# Patient Record
Sex: Female | Born: 1960 | Race: White | Hispanic: No | Marital: Married | Smoking: Current every day smoker
Health system: Southern US, Community
[De-identification: ages and names within clinical notes are randomized; demographics above are authoritative.]

## PROBLEM LIST (undated history)

## (undated) HISTORY — PX: CATARACT EXTRACTION: SUR2

## (undated) HISTORY — PX: TONSILLECTOMY: SUR1361

## (undated) HISTORY — PX: ASD REPAIR: SHX258

---

## 2011-06-24 ENCOUNTER — Ambulatory Visit: Payer: Self-pay | Admitting: Internal Medicine

## 2012-01-19 ENCOUNTER — Ambulatory Visit: Payer: Self-pay | Admitting: Family Medicine

## 2012-01-25 ENCOUNTER — Ambulatory Visit: Payer: Self-pay | Admitting: Family Medicine

## 2012-03-17 ENCOUNTER — Ambulatory Visit: Payer: Self-pay | Admitting: Family Medicine

## 2012-03-17 LAB — URINALYSIS, COMPLETE
Glucose,UR: NEGATIVE mg/dL (ref 0–75)
Ketone: NEGATIVE
Nitrite: NEGATIVE
Ph: 6 (ref 4.5–8.0)
RBC,UR: >30 /HPF (ref 0–5)
WBC UR: >30 /HPF (ref 0–5)

## 2012-03-19 LAB — URINE CULTURE

## 2013-02-28 ENCOUNTER — Ambulatory Visit: Payer: Self-pay | Admitting: Family Medicine

## 2015-10-13 ENCOUNTER — Other Ambulatory Visit: Payer: Self-pay | Admitting: Family Medicine

## 2015-10-13 DIAGNOSIS — Z1231 Encounter for screening mammogram for malignant neoplasm of breast: Secondary | ICD-10-CM

## 2016-07-26 ENCOUNTER — Encounter: Payer: Self-pay | Admitting: Emergency Medicine

## 2016-07-26 ENCOUNTER — Emergency Department: Payer: 59

## 2016-07-26 ENCOUNTER — Emergency Department
Admission: EM | Admit: 2016-07-26 | Discharge: 2016-07-26 | Disposition: A | Discharge status: Home / Self Care | Payer: 59 | Attending: Emergency Medicine | Admitting: Emergency Medicine

## 2016-07-26 DIAGNOSIS — Y9389 Activity, other specified: Secondary | ICD-10-CM | POA: Insufficient documentation

## 2016-07-26 DIAGNOSIS — S0101XA Laceration without foreign body of scalp, initial encounter: Secondary | ICD-10-CM | POA: Diagnosis not present

## 2016-07-26 DIAGNOSIS — Y999 Unspecified external cause status: Secondary | ICD-10-CM | POA: Insufficient documentation

## 2016-07-26 DIAGNOSIS — Y92009 Unspecified place in unspecified non-institutional (private) residence as the place of occurrence of the external cause: Secondary | ICD-10-CM | POA: Insufficient documentation

## 2016-07-26 DIAGNOSIS — F17200 Nicotine dependence, unspecified, uncomplicated: Secondary | ICD-10-CM | POA: Diagnosis not present

## 2016-07-26 DIAGNOSIS — W01118A Fall on same level from slipping, tripping and stumbling with subsequent striking against other sharp object, initial encounter: Secondary | ICD-10-CM | POA: Diagnosis not present

## 2016-07-26 DIAGNOSIS — S0990XA Unspecified injury of head, initial encounter: Secondary | ICD-10-CM

## 2016-07-26 MED ORDER — BACITRACIN ZINC 500 UNIT/GM EX OINT
TOPICAL_OINTMENT | Freq: Two times a day (BID) | CUTANEOUS | Status: DC
  Administered 2016-07-26: 1 via TOPICAL
  Filled 2016-07-26: qty 0.9

## 2016-07-26 MED ORDER — LIDOCAINE-EPINEPHRINE-TETRACAINE (LET) SOLUTION
3.0000 mL | Freq: Once | NASAL | Status: AC
  Administered 2016-07-26: 3 mL via TOPICAL
  Filled 2016-07-26: qty 3

## 2016-07-26 NOTE — ED Triage Notes (Addendum)
Patient ambulatory to triage with steady gait, without difficulty or distress noted; pt denies any c/o; st slipped on rug hitting head on nightstand; denies LOC or any c/o pain; approx 1" loc noted to occipital region with no active bleeding; no cervical tenderness with palpation

## 2016-07-26 NOTE — ED Notes (Addendum)
1cm/2cm posterior of head superficial laceration noted. Bleeding controlled. Pt denies headache, Tdap updated x3 years, denies taking anti-coagulant. Pt alerts and oriented x3 at this time.

## 2016-07-26 NOTE — ED Provider Notes (Signed)
[facility] Emergency Department Provider Note   ____________________________________________   First MD Initiated Contact with Patient 07/26/16 0345     (approximate)  I have reviewed the triage vital signs and the nursing notes.   HISTORY  Chief Complaint Fall    HPI Victoria Armstrong is a 55 y.o. female who comes into the hospital today with a head injury. The patient reports that she went to bed and got up to use the restroom. The patient got tangled on the rug and fell straight backwards. The patient's significant other reports that she hit the back of her head on the nightstand. The patient reports that she feels fine and she just wants to go to sleep. According to the significant other is sounded bad but the patient reports that it does not hurt very much. The patient denies any loss of consciousness, dizziness, blurred vision. She reports that she just feels tired and wants to go to sleep. The patient reports she is here because her significant other made her come in to get evaluated.   History reviewed. No pertinent past medical history.  There are no active problems to display for this patient.   Past Surgical History:  Procedure Laterality Date  . ASD REPAIR    . CATARACT EXTRACTION    . TONSILLECTOMY      Prior to Admission medications   Not on File    Allergies Amoxicillin  No family history on file.  Social History Social History  Substance Use Topics  . Smoking status: Current Every Day Smoker  . Smokeless tobacco: Not on file  . Alcohol use No    Review of Systems Constitutional: No fever/chills Eyes: No visual changes. ENT: No sore throat. Cardiovascular: Denies chest pain. Respiratory: Denies shortness of breath. Gastrointestinal: No abdominal pain.  No nausea, no vomiting.  No diarrhea.  No constipation. Genitourinary: Negative for dysuria. Musculoskeletal: Negative for back pain. Skin: Negative for  rash. Neurological: head pain  10-point ROS otherwise negative.  ____________________________________________   PHYSICAL EXAM:  VITAL SIGNS: ED Triage Vitals  Enc Vitals Group     BP 07/26/16 0055 130/78     Pulse Rate 07/26/16 0055 84     Resp 07/26/16 0055 20     Temp 07/26/16 0055 97.7 F (36.5 C)     Temp Source 07/26/16 0055 Oral     SpO2 07/26/16 0055 98 %     Weight 07/26/16 0055 158 lb (71.7 kg)     Height 07/26/16 0055 5\' 4"  (1.626 m)     Head Circumference --      Peak Flow --      Pain Score 07/26/16 0342 0     Pain Loc --      Pain Edu? --      Excl. in GC? --     Constitutional: Alert and oriented. Well appearing and in no acute distress. Eyes: Conjunctivae are normal. PERRL. EOMI. Head: Atraumatic. Nose: No congestion/rhinnorhea. Mouth/Throat: Mucous membranes are moist.  Oropharynx non-erythematous. Neck: No cervical spine tenderness to palpation Cardiovascular: Normal rate, regular rhythm. Grossly normal heart sounds.  Good peripheral circulation. Respiratory: Normal respiratory effort.  No retractions. Lungs CTAB. Gastrointestinal: Soft and nontender. No distention.positive bowel sounds Musculoskeletal: No lower extremity tenderness nor edema.   Neurologic:  Normal speech and language. No gross focal neurologic deficits are appreciated. No gait instability. Skin:  Skin is warm, dry with posterior scalp laceration.  Psychiatric: Mood and affect are normal.  ____________________________________________   LABS (all labs ordered are listed, but only abnormal results are displayed)  Labs Reviewed - No data to display ____________________________________________  EKG  none ____________________________________________  RADIOLOGY  CT head ____________________________________________   PROCEDURES  Procedure(s) performed: please, see procedure note(s).  Marland Kitchen.Laceration Repair Date/Time: 07/26/2016 4:35 AM Performed by: Rebecka Apley Authorized by: Rebecka Apley   Consent:    Consent obtained:  Verbal   Consent given by:  Patient   Risks discussed:  Infection and pain   Alternatives discussed:  No treatment Anesthesia (see MAR for exact dosages):    Anesthesia method:  Local infiltration   Local anesthetic:  Lidocaine 1% w/o epi Laceration details:    Location:  Scalp   Length (cm):  3 Repair type:    Repair type:  Simple Exploration:    Contaminated: no   Treatment:    Area cleansed with:  Saline   Amount of cleaning:  Standard   Irrigation solution:  Sterile saline   Irrigation method:  Syringe   Visualized foreign bodies/material removed: no   Skin repair:    Repair method:  Staples   Number of staples:  3 Post-procedure details:    Dressing:  Antibiotic ointment   Patient tolerance of procedure:  Tolerated well, no immediate complications     Critical Care performed: No  ____________________________________________   INITIAL IMPRESSION / ASSESSMENT AND PLAN / ED COURSE  Pertinent labs & imaging results that were available during my care of the patient were reviewed by me and considered in my medical decision making (see chart for details).  This is a 55 year old female who comes into the hospital today with a head injury. The patient had a fall at home. The patient does not have any dizziness or chest pain she just lost her balance. I did repair the patient's laceration with staples and she does need to have those removed in a few days.  Clinical Course  Value Comment By Time  CT Head Wo Contrast Small RIGHT posterior scalp hematoma. No skull fracture.  Otherwise negative CT HEAD.   Rebecka Apley, MD 10/03 0424   The patient's CT scan shows no skull fracture nor intracranial hemorrhage. The patient be discharged home to follow-up with her primary care physician to have her a post removed.  ____________________________________________   FINAL CLINICAL IMPRESSION(S) / ED  DIAGNOSES  Final diagnoses:  Injury of head, initial encounter  Laceration of scalp, initial encounter      NEW MEDICATIONS STARTED DURING THIS VISIT:  There are no discharge medications for this patient.    Note:  This document was prepared using Dragon voice recognition software and may include unintentional dictation errors.    Rebecka Apley, MD 07/26/16 [phone]

## 2018-09-05 IMAGING — CT CT HEAD W/O CM
3 series · 16 of 46 positions shown, 19 images · non-contrast
Comparison: None.

CLINICAL DATA: Slipped on rug, hit head on night stand. No loss of
consciousness. Posterior head laceration.

EXAM:
CT HEAD WITHOUT CONTRAST
TECHNIQUE: Contiguous axial images were obtained from the base of the skull
through the vertex without intravenous contrast.

[Series 2: head wo · axial · 0.41mm/px · z∈[-152,-32]mm · 10 of 29 slices shown, 13 images]
[im 3/29  brain]
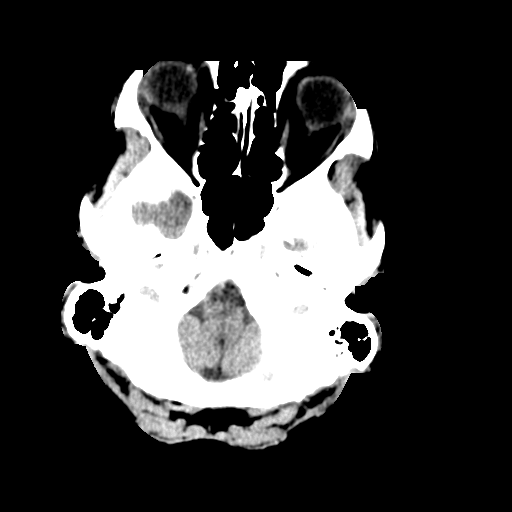
[im 3/29  bone]
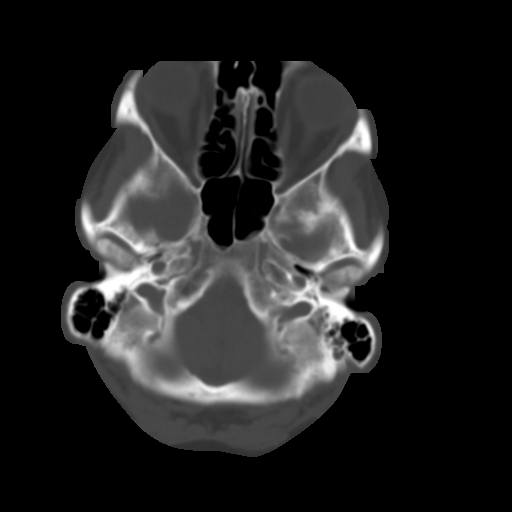
[im 6/29  brain]
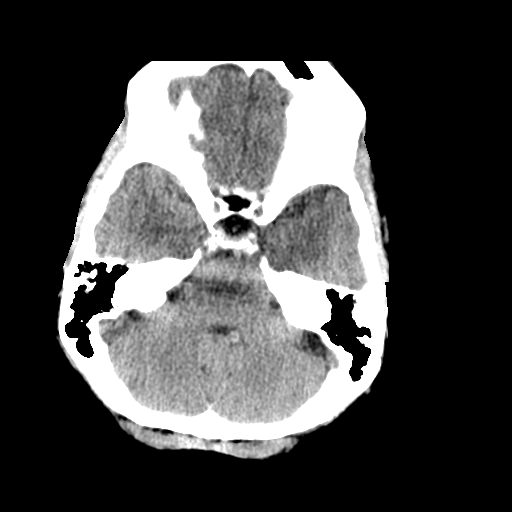
[im 8/29  brain]
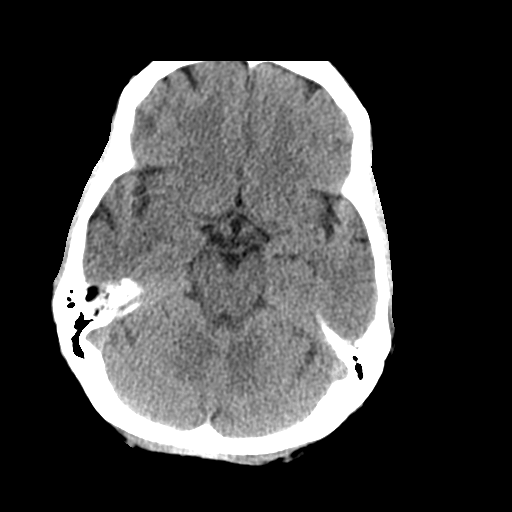
[im 11/29  brain]
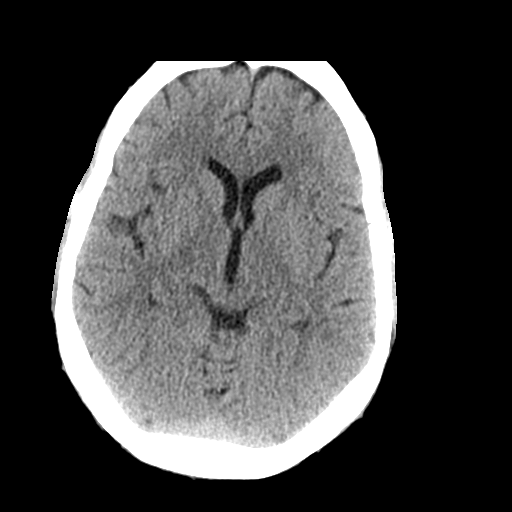
[im 14/29  brain]
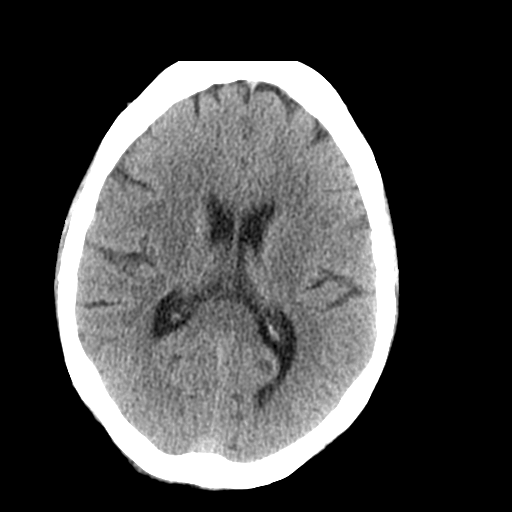
[im 14/29  bone]
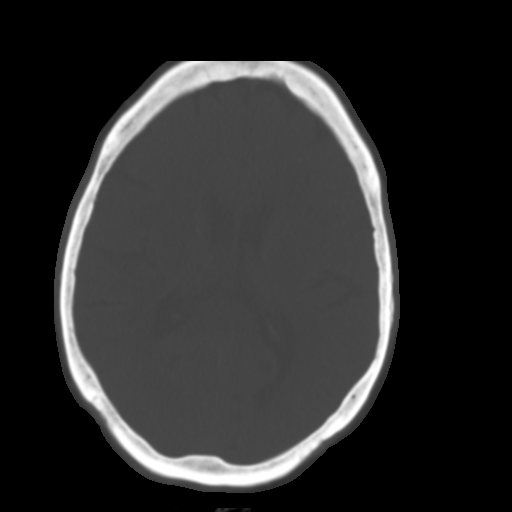
[im 16/29  brain]
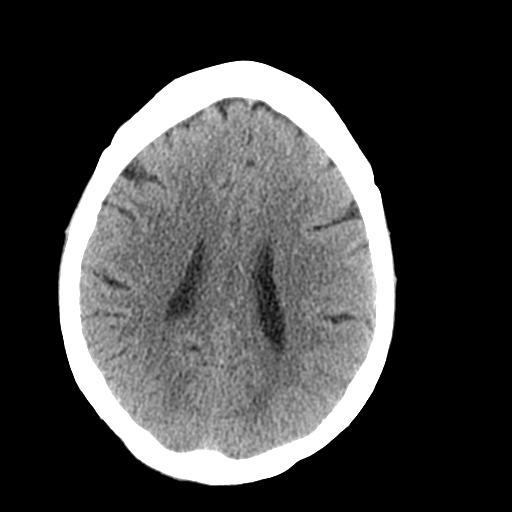
[im 19/29  brain]
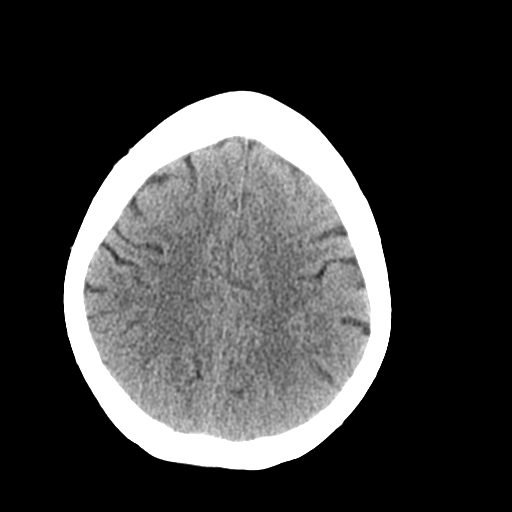
[im 22/29  brain]
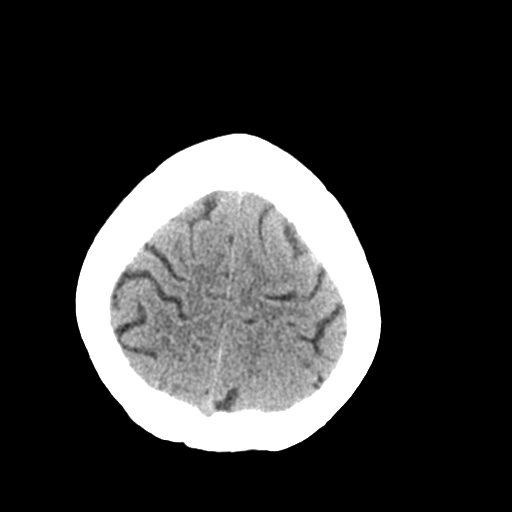
[im 24/29  brain]
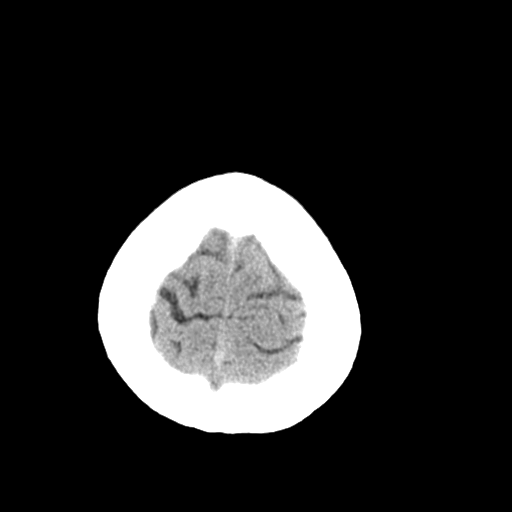
[im 24/29  bone]
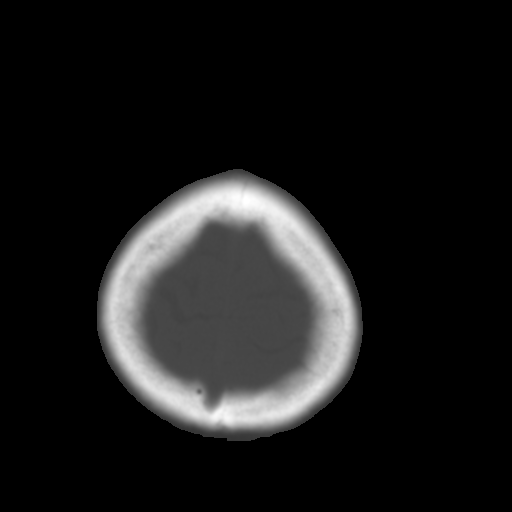
[im 27/29  brain]
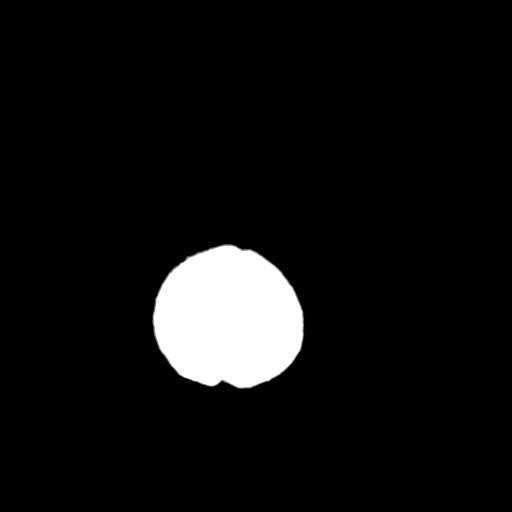

[Series 4: coronal soft tissue · coronal · 0.29mm/px · 3 of 62 slices shown]
[im 21/62  brain]
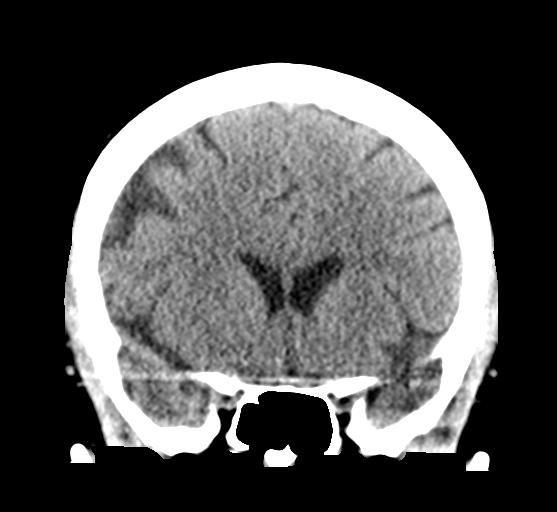
[im 28/62  brain]
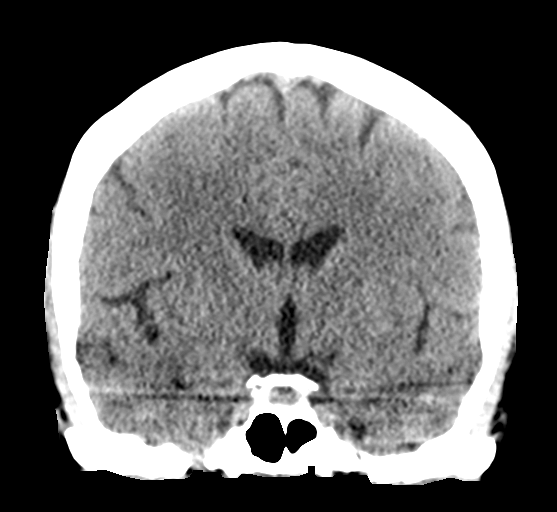
[im 34/62  brain]
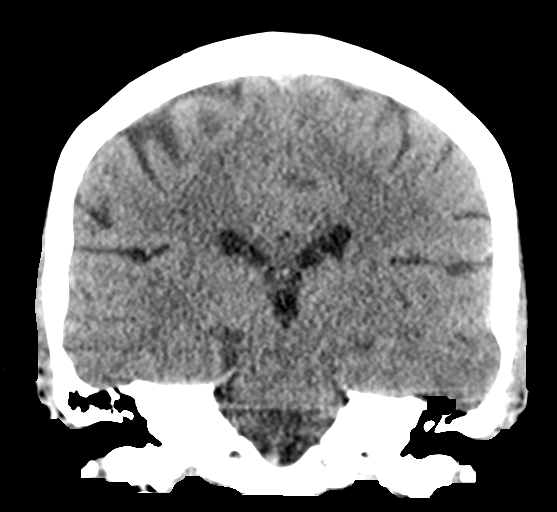

[Series 5: sagittal soft tissue · sagittal · 0.33mm/px · 3 of 51 slices shown]
[im 17/51  brain]
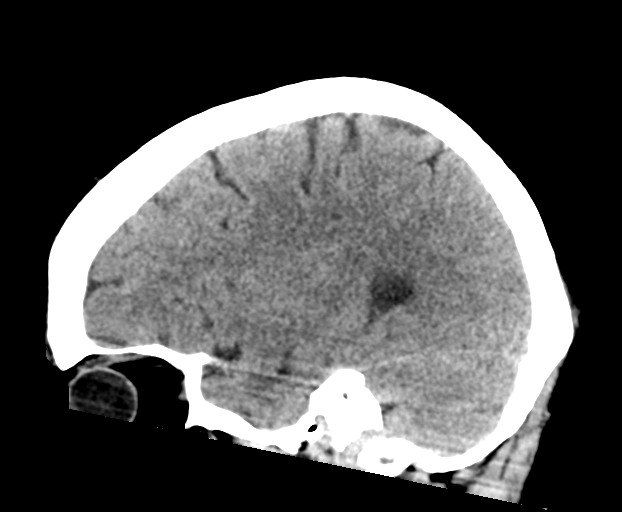
[im 26/51  brain]
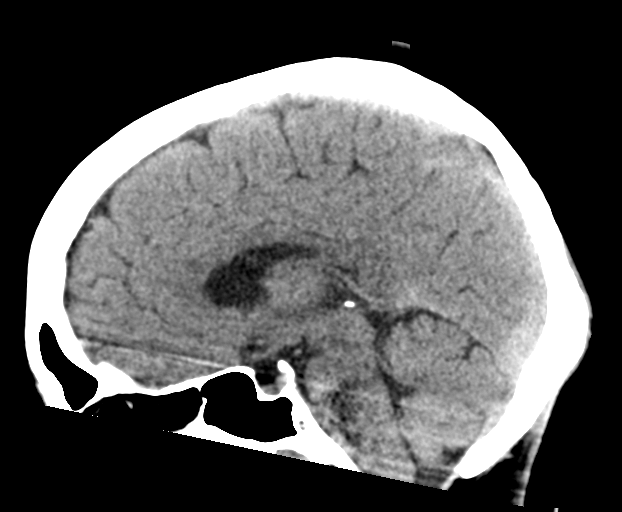
[im 34/51  brain]
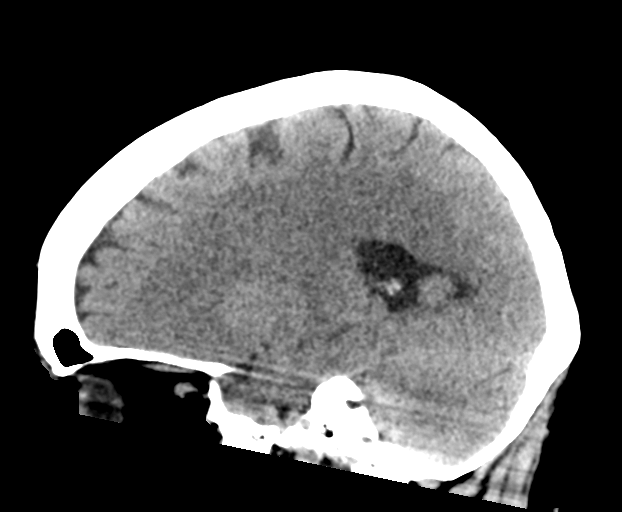

[16 of 46 positions shown; findings below may reference images not displayed]

FINDINGS: BRAIN: The ventricles and sulci are normal. No intraparenchymal
hemorrhage, mass effect nor midline shift. No acute large vascular
territory infarcts. No abnormal extra-axial fluid collections. Basal
cisterns are patent. Beam hardening artifact through brainstem.

VASCULAR: Unremarkable.

SKULL/SOFT TISSUES: No skull fracture. Small RIGHT occipital scalp
hematoma. No subcutaneous gas or radiopaque foreign bodies.

ORBITS/SINUSES: The included ocular globes and orbital contents are
normal.The mastoid aircells and included paranasal sinuses are
well-aerated.

OTHER: None.
IMPRESSION: Small RIGHT posterior scalp hematoma.  No skull fracture.

Otherwise negative CT HEAD.
